# Patient Record
Sex: Female | Born: 1950
Health system: Southern US, Community
[De-identification: ages and names within clinical notes are randomized; demographics above are authoritative.]

---

## 2007-07-01 ENCOUNTER — Encounter: Admission: RE | Admit: 2007-07-01 | Discharge: 2007-07-01 | Payer: Self-pay | Admitting: Family Medicine

## 2009-10-04 ENCOUNTER — Inpatient Hospital Stay (HOSPITAL_COMMUNITY): Admission: EM | Admit: 2009-10-04 | Discharge: 2009-10-07 | Payer: Self-pay | Admitting: Emergency Medicine

## 2010-07-08 LAB — DIFFERENTIAL
Basophils Relative: 0 % (ref 0–1)
Eosinophils Absolute: 0 10*3/uL (ref 0.0–0.7)
Eosinophils Relative: 0 % (ref 0–5)
Neutrophils Relative %: 86 % — ABNORMAL HIGH (ref 43–77)

## 2010-07-08 LAB — BASIC METABOLIC PANEL
BUN: 11 mg/dL (ref 6–23)
BUN: 20 mg/dL (ref 6–23)
BUN: 8 mg/dL (ref 6–23)
CO2: 24 mEq/L (ref 19–32)
CO2: 26 mEq/L (ref 19–32)
Calcium: 8.3 mg/dL — ABNORMAL LOW (ref 8.4–10.5)
Calcium: 8.5 mg/dL (ref 8.4–10.5)
Chloride: 109 mEq/L (ref 96–112)
Chloride: 109 mEq/L (ref 96–112)
Creatinine, Ser: 0.76 mg/dL (ref 0.4–1.2)
Creatinine, Ser: 0.91 mg/dL (ref 0.4–1.2)
GFR calc Af Amer: >60 mL/min (ref >60)
GFR calc non Af Amer: >60 mL/min (ref >60)
GFR calc non Af Amer: >60 mL/min (ref >60)
Glucose, Bld: 161 mg/dL — ABNORMAL HIGH (ref 70–99)
Glucose, Bld: 195 mg/dL — ABNORMAL HIGH (ref 70–99)
Potassium: 3.1 mEq/L — ABNORMAL LOW (ref 3.5–5.1)
Potassium: 3.9 mEq/L (ref 3.5–5.1)
Sodium: 138 mEq/L (ref 135–145)
Sodium: 140 mEq/L (ref 135–145)

## 2010-07-08 LAB — COMPREHENSIVE METABOLIC PANEL
ALT: 65 U/L — ABNORMAL HIGH (ref 0–35)
AST: 77 U/L — ABNORMAL HIGH (ref 0–37)
Alkaline Phosphatase: 176 U/L — ABNORMAL HIGH (ref 39–117)
CO2: 24 mEq/L (ref 19–32)
Chloride: 101 mEq/L (ref 96–112)
GFR calc non Af Amer: 49 mL/min — ABNORMAL LOW (ref >60)
Glucose, Bld: 618 mg/dL (ref 70–99)
Potassium: 4.3 mEq/L (ref 3.5–5.1)
Sodium: 133 mEq/L — ABNORMAL LOW (ref 135–145)

## 2010-07-08 LAB — CBC
HCT: 27 % — ABNORMAL LOW (ref 36.0–46.0)
HCT: 27.1 % — ABNORMAL LOW (ref 36.0–46.0)
HCT: 28.6 % — ABNORMAL LOW (ref 36.0–46.0)
Hemoglobin: 7.7 g/dL — ABNORMAL LOW (ref 12.0–15.0)
Hemoglobin: 8.2 g/dL — ABNORMAL LOW (ref 12.0–15.0)
Hemoglobin: 8.9 g/dL — ABNORMAL LOW (ref 12.0–15.0)
MCHC: 31.4 g/dL (ref 30.0–36.0)
MCHC: 32 g/dL (ref 30.0–36.0)
MCHC: 32.2 g/dL (ref 30.0–36.0)
MCHC: 32.4 g/dL (ref 30.0–36.0)
MCHC: 32.4 g/dL (ref 30.0–36.0)
MCHC: 32.8 g/dL (ref 30.0–36.0)
MCV: 84.5 fL (ref 78.0–100.0)
MCV: 85.5 fL (ref 78.0–100.0)
Platelets: 278 10*3/uL (ref 150–400)
Platelets: 284 10*3/uL (ref 150–400)
Platelets: 296 10*3/uL (ref 150–400)
Platelets: 323 10*3/uL (ref 150–400)
Platelets: 331 10*3/uL (ref 150–400)
RBC: 2.75 MIL/uL — ABNORMAL LOW (ref 3.87–5.11)
RBC: 3.21 MIL/uL — ABNORMAL LOW (ref 3.87–5.11)
RDW: 12.7 % (ref 11.5–15.5)
RDW: 13.3 % (ref 11.5–15.5)
RDW: 13.4 % (ref 11.5–15.5)
RDW: 13.6 % (ref 11.5–15.5)
RDW: 13.7 % (ref 11.5–15.5)
RDW: 13.7 % (ref 11.5–15.5)
RDW: 13.7 % (ref 11.5–15.5)
WBC: 11.6 10*3/uL — ABNORMAL HIGH (ref 4.0–10.5)
WBC: 8.6 10*3/uL (ref 4.0–10.5)

## 2010-07-08 LAB — FERRITIN: Ferritin: 70 ng/mL (ref 10–291)

## 2010-07-08 LAB — URINALYSIS, ROUTINE W REFLEX MICROSCOPIC
Bilirubin Urine: NEGATIVE
Glucose, UA: >1000 mg/dL — AB
Nitrite: NEGATIVE
pH: 6 (ref 5.0–8.0)

## 2010-07-08 LAB — GLUCOSE, CAPILLARY
Glucose-Capillary: 129 mg/dL — ABNORMAL HIGH (ref 70–99)
Glucose-Capillary: 140 mg/dL — ABNORMAL HIGH (ref 70–99)
Glucose-Capillary: 153 mg/dL — ABNORMAL HIGH (ref 70–99)
Glucose-Capillary: 159 mg/dL — ABNORMAL HIGH (ref 70–99)
Glucose-Capillary: 163 mg/dL — ABNORMAL HIGH (ref 70–99)
Glucose-Capillary: 187 mg/dL — ABNORMAL HIGH (ref 70–99)
Glucose-Capillary: 212 mg/dL — ABNORMAL HIGH (ref 70–99)
Glucose-Capillary: 218 mg/dL — ABNORMAL HIGH (ref 70–99)
Glucose-Capillary: 220 mg/dL — ABNORMAL HIGH (ref 70–99)
Glucose-Capillary: 274 mg/dL — ABNORMAL HIGH (ref 70–99)
Glucose-Capillary: 287 mg/dL — ABNORMAL HIGH (ref 70–99)
Glucose-Capillary: 389 mg/dL — ABNORMAL HIGH (ref 70–99)
Glucose-Capillary: 548 mg/dL — ABNORMAL HIGH (ref 70–99)

## 2010-07-08 LAB — TYPE AND SCREEN

## 2010-07-08 LAB — RETICULOCYTES
RBC.: 3.52 MIL/uL — ABNORMAL LOW (ref 3.87–5.11)
Retic Count, Absolute: 73.9 10*3/uL (ref 19.0–186.0)
Retic Ct Pct: 2.1 % (ref 0.4–3.1)

## 2010-07-08 LAB — MAGNESIUM: Magnesium: 1.6 mg/dL (ref 1.5–2.5)

## 2010-07-08 LAB — HEMOGLOBIN A1C: Mean Plasma Glucose: 292 mg/dL — ABNORMAL HIGH (ref <117)

## 2010-07-08 LAB — URINE MICROSCOPIC-ADD ON

## 2010-07-08 LAB — PHOSPHORUS: Phosphorus: 3.2 mg/dL (ref 2.3–4.6)

## 2010-07-08 LAB — VITAMIN B12: Vitamin B-12: 483 pg/mL (ref 211–911)

## 2010-07-08 LAB — IRON AND TIBC
Iron: 113 ug/dL (ref 42–135)
TIBC: 278 ug/dL (ref 250–470)

## 2010-07-08 LAB — MRSA PCR SCREENING: MRSA by PCR: NEGATIVE

## 2010-07-08 LAB — ABO/RH: ABO/RH(D): B POS

## 2013-09-03 ENCOUNTER — Other Ambulatory Visit: Payer: Self-pay | Admitting: Gastroenterology

## 2013-09-03 DIAGNOSIS — R945 Abnormal results of liver function studies: Principal | ICD-10-CM

## 2013-09-03 DIAGNOSIS — R7989 Other specified abnormal findings of blood chemistry: Secondary | ICD-10-CM

## 2013-09-17 ENCOUNTER — Other Ambulatory Visit: Payer: Self-pay

## 2013-10-12 ENCOUNTER — Inpatient Hospital Stay: Admission: RE | Admit: 2013-10-12 | Payer: Self-pay | Source: Ambulatory Visit

## 2013-10-12 ENCOUNTER — Ambulatory Visit
Admission: RE | Admit: 2013-10-12 | Discharge: 2013-10-12 | Disposition: A | Discharge status: Home / Self Care | Payer: Federal, State, Local not specified - PPO | Source: Ambulatory Visit | Attending: Gastroenterology | Admitting: Gastroenterology

## 2013-10-12 DIAGNOSIS — R7989 Other specified abnormal findings of blood chemistry: Secondary | ICD-10-CM

## 2013-10-12 DIAGNOSIS — R945 Abnormal results of liver function studies: Principal | ICD-10-CM

## 2013-10-12 MED ORDER — IOHEXOL 300 MG/ML  SOLN
125.0000 mL | Freq: Once | INTRAMUSCULAR | Status: AC | PRN
  Administered 2013-10-12: 125 mL via INTRAVENOUS

## 2014-09-11 DIAGNOSIS — E161 Other hypoglycemia: Secondary | ICD-10-CM | POA: Diagnosis not present

## 2014-11-29 DIAGNOSIS — I10 Essential (primary) hypertension: Secondary | ICD-10-CM | POA: Diagnosis not present

## 2014-11-29 DIAGNOSIS — E119 Type 2 diabetes mellitus without complications: Secondary | ICD-10-CM | POA: Diagnosis not present

## 2014-11-29 DIAGNOSIS — E780 Pure hypercholesterolemia: Secondary | ICD-10-CM | POA: Diagnosis not present

## 2014-11-29 DIAGNOSIS — K219 Gastro-esophageal reflux disease without esophagitis: Secondary | ICD-10-CM | POA: Diagnosis not present

## 2014-11-29 DIAGNOSIS — Z1389 Encounter for screening for other disorder: Secondary | ICD-10-CM | POA: Diagnosis not present

## 2014-11-29 DIAGNOSIS — R609 Edema, unspecified: Secondary | ICD-10-CM | POA: Diagnosis not present

## 2014-12-28 DIAGNOSIS — G4733 Obstructive sleep apnea (adult) (pediatric): Secondary | ICD-10-CM | POA: Diagnosis not present

## 2015-01-03 DIAGNOSIS — E780 Pure hypercholesterolemia: Secondary | ICD-10-CM | POA: Diagnosis not present

## 2015-01-03 DIAGNOSIS — I10 Essential (primary) hypertension: Secondary | ICD-10-CM | POA: Diagnosis not present

## 2015-01-03 DIAGNOSIS — R609 Edema, unspecified: Secondary | ICD-10-CM | POA: Diagnosis not present

## 2015-01-03 DIAGNOSIS — E119 Type 2 diabetes mellitus without complications: Secondary | ICD-10-CM | POA: Diagnosis not present

## 2015-06-28 DIAGNOSIS — E162 Hypoglycemia, unspecified: Secondary | ICD-10-CM | POA: Diagnosis not present

## 2015-06-28 DIAGNOSIS — E161 Other hypoglycemia: Secondary | ICD-10-CM | POA: Diagnosis not present

## 2015-07-13 DIAGNOSIS — E7800 Pure hypercholesterolemia, unspecified: Secondary | ICD-10-CM | POA: Diagnosis not present

## 2015-07-13 DIAGNOSIS — Z794 Long term (current) use of insulin: Secondary | ICD-10-CM | POA: Diagnosis not present

## 2015-07-13 DIAGNOSIS — K219 Gastro-esophageal reflux disease without esophagitis: Secondary | ICD-10-CM | POA: Diagnosis not present

## 2015-07-13 DIAGNOSIS — Z0000 Encounter for general adult medical examination without abnormal findings: Secondary | ICD-10-CM | POA: Diagnosis not present

## 2015-07-13 DIAGNOSIS — Z6841 Body Mass Index (BMI) 40.0 and over, adult: Secondary | ICD-10-CM | POA: Diagnosis not present

## 2015-07-13 DIAGNOSIS — E1065 Type 1 diabetes mellitus with hyperglycemia: Secondary | ICD-10-CM | POA: Diagnosis not present

## 2015-07-13 DIAGNOSIS — R609 Edema, unspecified: Secondary | ICD-10-CM | POA: Diagnosis not present

## 2015-07-13 DIAGNOSIS — I10 Essential (primary) hypertension: Secondary | ICD-10-CM | POA: Diagnosis not present

## 2015-07-13 DIAGNOSIS — Z1239 Encounter for other screening for malignant neoplasm of breast: Secondary | ICD-10-CM | POA: Diagnosis not present

## 2015-07-21 DIAGNOSIS — Z1231 Encounter for screening mammogram for malignant neoplasm of breast: Secondary | ICD-10-CM | POA: Diagnosis not present

## 2015-07-21 DIAGNOSIS — Z131 Encounter for screening for diabetes mellitus: Secondary | ICD-10-CM | POA: Diagnosis not present

## 2015-07-21 DIAGNOSIS — Z803 Family history of malignant neoplasm of breast: Secondary | ICD-10-CM | POA: Diagnosis not present

## 2015-08-01 DIAGNOSIS — E119 Type 2 diabetes mellitus without complications: Secondary | ICD-10-CM | POA: Diagnosis not present

## 2015-08-01 DIAGNOSIS — E7800 Pure hypercholesterolemia, unspecified: Secondary | ICD-10-CM | POA: Diagnosis not present

## 2015-08-01 DIAGNOSIS — R609 Edema, unspecified: Secondary | ICD-10-CM | POA: Diagnosis not present

## 2015-08-01 DIAGNOSIS — I10 Essential (primary) hypertension: Secondary | ICD-10-CM | POA: Diagnosis not present

## 2015-09-19 DIAGNOSIS — E7800 Pure hypercholesterolemia, unspecified: Secondary | ICD-10-CM | POA: Diagnosis not present

## 2015-11-22 DIAGNOSIS — E7800 Pure hypercholesterolemia, unspecified: Secondary | ICD-10-CM | POA: Diagnosis not present

## 2015-11-22 DIAGNOSIS — E119 Type 2 diabetes mellitus without complications: Secondary | ICD-10-CM | POA: Diagnosis not present

## 2015-11-22 DIAGNOSIS — R609 Edema, unspecified: Secondary | ICD-10-CM | POA: Diagnosis not present

## 2015-11-29 DIAGNOSIS — I10 Essential (primary) hypertension: Secondary | ICD-10-CM | POA: Diagnosis not present

## 2015-11-29 DIAGNOSIS — E7800 Pure hypercholesterolemia, unspecified: Secondary | ICD-10-CM | POA: Diagnosis not present

## 2015-11-29 DIAGNOSIS — R609 Edema, unspecified: Secondary | ICD-10-CM | POA: Diagnosis not present

## 2015-11-29 DIAGNOSIS — E119 Type 2 diabetes mellitus without complications: Secondary | ICD-10-CM | POA: Diagnosis not present

## 2015-12-06 DIAGNOSIS — E119 Type 2 diabetes mellitus without complications: Secondary | ICD-10-CM | POA: Diagnosis not present

## 2016-01-01 DIAGNOSIS — G4733 Obstructive sleep apnea (adult) (pediatric): Secondary | ICD-10-CM | POA: Diagnosis not present

## 2016-01-03 DIAGNOSIS — G4733 Obstructive sleep apnea (adult) (pediatric): Secondary | ICD-10-CM | POA: Diagnosis not present

## 2016-01-15 DIAGNOSIS — E7800 Pure hypercholesterolemia, unspecified: Secondary | ICD-10-CM | POA: Diagnosis not present

## 2016-01-15 DIAGNOSIS — I10 Essential (primary) hypertension: Secondary | ICD-10-CM | POA: Diagnosis not present

## 2016-01-15 DIAGNOSIS — E1065 Type 1 diabetes mellitus with hyperglycemia: Secondary | ICD-10-CM | POA: Diagnosis not present

## 2016-01-15 DIAGNOSIS — R609 Edema, unspecified: Secondary | ICD-10-CM | POA: Diagnosis not present

## 2016-01-15 DIAGNOSIS — K219 Gastro-esophageal reflux disease without esophagitis: Secondary | ICD-10-CM | POA: Diagnosis not present

## 2016-01-15 DIAGNOSIS — S46111A Strain of muscle, fascia and tendon of long head of biceps, right arm, initial encounter: Secondary | ICD-10-CM | POA: Diagnosis not present

## 2016-01-15 DIAGNOSIS — Z1389 Encounter for screening for other disorder: Secondary | ICD-10-CM | POA: Diagnosis not present

## 2016-01-25 DIAGNOSIS — E7800 Pure hypercholesterolemia, unspecified: Secondary | ICD-10-CM | POA: Diagnosis not present

## 2016-01-25 DIAGNOSIS — I10 Essential (primary) hypertension: Secondary | ICD-10-CM | POA: Diagnosis not present

## 2016-04-02 DIAGNOSIS — I10 Essential (primary) hypertension: Secondary | ICD-10-CM | POA: Diagnosis not present

## 2016-04-02 DIAGNOSIS — E119 Type 2 diabetes mellitus without complications: Secondary | ICD-10-CM | POA: Diagnosis not present

## 2016-04-02 DIAGNOSIS — E7800 Pure hypercholesterolemia, unspecified: Secondary | ICD-10-CM | POA: Diagnosis not present

## 2016-04-02 DIAGNOSIS — R609 Edema, unspecified: Secondary | ICD-10-CM | POA: Diagnosis not present

## 2016-05-07 DIAGNOSIS — H538 Other visual disturbances: Secondary | ICD-10-CM | POA: Diagnosis not present

## 2016-05-07 DIAGNOSIS — E119 Type 2 diabetes mellitus without complications: Secondary | ICD-10-CM | POA: Diagnosis not present

## 2016-06-24 DIAGNOSIS — J45909 Unspecified asthma, uncomplicated: Secondary | ICD-10-CM | POA: Diagnosis not present

## 2016-07-17 DIAGNOSIS — K219 Gastro-esophageal reflux disease without esophagitis: Secondary | ICD-10-CM | POA: Diagnosis not present

## 2016-07-17 DIAGNOSIS — R609 Edema, unspecified: Secondary | ICD-10-CM | POA: Diagnosis not present

## 2016-07-17 DIAGNOSIS — Z0000 Encounter for general adult medical examination without abnormal findings: Secondary | ICD-10-CM | POA: Diagnosis not present

## 2016-07-17 DIAGNOSIS — E7800 Pure hypercholesterolemia, unspecified: Secondary | ICD-10-CM | POA: Diagnosis not present

## 2016-07-17 DIAGNOSIS — I10 Essential (primary) hypertension: Secondary | ICD-10-CM | POA: Diagnosis not present

## 2016-07-26 DIAGNOSIS — R609 Edema, unspecified: Secondary | ICD-10-CM | POA: Diagnosis not present

## 2016-07-26 DIAGNOSIS — E7800 Pure hypercholesterolemia, unspecified: Secondary | ICD-10-CM | POA: Diagnosis not present

## 2016-07-26 DIAGNOSIS — I10 Essential (primary) hypertension: Secondary | ICD-10-CM | POA: Diagnosis not present

## 2016-07-26 DIAGNOSIS — E119 Type 2 diabetes mellitus without complications: Secondary | ICD-10-CM | POA: Diagnosis not present

## 2016-08-02 DIAGNOSIS — I10 Essential (primary) hypertension: Secondary | ICD-10-CM | POA: Diagnosis not present

## 2016-08-02 DIAGNOSIS — R609 Edema, unspecified: Secondary | ICD-10-CM | POA: Diagnosis not present

## 2016-08-02 DIAGNOSIS — E119 Type 2 diabetes mellitus without complications: Secondary | ICD-10-CM | POA: Diagnosis not present

## 2016-08-02 DIAGNOSIS — E7800 Pure hypercholesterolemia, unspecified: Secondary | ICD-10-CM | POA: Diagnosis not present

## 2016-08-14 DIAGNOSIS — Z1231 Encounter for screening mammogram for malignant neoplasm of breast: Secondary | ICD-10-CM | POA: Diagnosis not present

## 2016-08-14 DIAGNOSIS — Z803 Family history of malignant neoplasm of breast: Secondary | ICD-10-CM | POA: Diagnosis not present

## 2016-08-23 DIAGNOSIS — R7989 Other specified abnormal findings of blood chemistry: Secondary | ICD-10-CM | POA: Diagnosis not present

## 2016-10-29 DIAGNOSIS — R748 Abnormal levels of other serum enzymes: Secondary | ICD-10-CM | POA: Diagnosis not present

## 2016-10-29 DIAGNOSIS — K7460 Unspecified cirrhosis of liver: Secondary | ICD-10-CM | POA: Diagnosis not present

## 2016-11-05 ENCOUNTER — Other Ambulatory Visit: Payer: Self-pay | Admitting: Gastroenterology

## 2016-11-05 DIAGNOSIS — K7460 Unspecified cirrhosis of liver: Secondary | ICD-10-CM

## 2016-11-05 DIAGNOSIS — R748 Abnormal levels of other serum enzymes: Secondary | ICD-10-CM

## 2016-11-08 DIAGNOSIS — R748 Abnormal levels of other serum enzymes: Secondary | ICD-10-CM | POA: Diagnosis not present

## 2016-11-21 DIAGNOSIS — R748 Abnormal levels of other serum enzymes: Secondary | ICD-10-CM | POA: Diagnosis not present

## 2016-11-22 DIAGNOSIS — R748 Abnormal levels of other serum enzymes: Secondary | ICD-10-CM | POA: Diagnosis not present

## 2016-11-22 DIAGNOSIS — K7460 Unspecified cirrhosis of liver: Secondary | ICD-10-CM | POA: Diagnosis not present

## 2016-11-23 ENCOUNTER — Ambulatory Visit
Admission: RE | Admit: 2016-11-23 | Discharge: 2016-11-23 | Disposition: A | Discharge status: Home / Self Care | Payer: Federal, State, Local not specified - PPO | Source: Ambulatory Visit | Attending: Gastroenterology | Admitting: Gastroenterology

## 2016-11-23 DIAGNOSIS — R748 Abnormal levels of other serum enzymes: Secondary | ICD-10-CM

## 2016-11-23 DIAGNOSIS — K7460 Unspecified cirrhosis of liver: Secondary | ICD-10-CM | POA: Diagnosis not present

## 2016-11-23 MED ORDER — GADOBENATE DIMEGLUMINE 529 MG/ML IV SOLN: 20.0000 mL | Freq: Once | INTRAVENOUS | Status: DC | PRN

## 2016-12-10 ENCOUNTER — Other Ambulatory Visit (HOSPITAL_COMMUNITY): Payer: Self-pay | Admitting: Gastroenterology

## 2016-12-10 DIAGNOSIS — R945 Abnormal results of liver function studies: Principal | ICD-10-CM

## 2016-12-10 DIAGNOSIS — R7989 Other specified abnormal findings of blood chemistry: Secondary | ICD-10-CM

## 2016-12-13 DIAGNOSIS — R748 Abnormal levels of other serum enzymes: Secondary | ICD-10-CM | POA: Diagnosis not present

## 2016-12-13 DIAGNOSIS — R609 Edema, unspecified: Secondary | ICD-10-CM | POA: Diagnosis not present

## 2016-12-13 DIAGNOSIS — I10 Essential (primary) hypertension: Secondary | ICD-10-CM | POA: Diagnosis not present

## 2016-12-13 DIAGNOSIS — E119 Type 2 diabetes mellitus without complications: Secondary | ICD-10-CM | POA: Diagnosis not present

## 2016-12-13 DIAGNOSIS — E7800 Pure hypercholesterolemia, unspecified: Secondary | ICD-10-CM | POA: Diagnosis not present

## 2016-12-17 ENCOUNTER — Other Ambulatory Visit: Payer: Self-pay | Admitting: Student

## 2016-12-18 ENCOUNTER — Other Ambulatory Visit: Payer: Self-pay | Admitting: Student

## 2016-12-18 ENCOUNTER — Other Ambulatory Visit: Payer: Self-pay | Admitting: Radiology

## 2016-12-19 ENCOUNTER — Ambulatory Visit (HOSPITAL_COMMUNITY)
Admission: RE | Admit: 2016-12-19 | Discharge: 2016-12-19 | Disposition: A | Discharge status: Home / Self Care | Payer: Federal, State, Local not specified - PPO | Source: Ambulatory Visit | Attending: Gastroenterology | Admitting: Gastroenterology

## 2016-12-19 DIAGNOSIS — K740 Hepatic fibrosis: Secondary | ICD-10-CM | POA: Insufficient documentation

## 2016-12-19 DIAGNOSIS — K739 Chronic hepatitis, unspecified: Secondary | ICD-10-CM | POA: Diagnosis not present

## 2016-12-19 DIAGNOSIS — R7989 Other specified abnormal findings of blood chemistry: Secondary | ICD-10-CM

## 2016-12-19 DIAGNOSIS — R945 Abnormal results of liver function studies: Secondary | ICD-10-CM

## 2016-12-19 DIAGNOSIS — K765 Hepatic veno-occlusive disease: Secondary | ICD-10-CM | POA: Diagnosis not present

## 2016-12-19 LAB — PROTIME-INR
INR: 0.86
Prothrombin Time: 11.6 seconds (ref 11.4–15.2)

## 2016-12-19 LAB — APTT: aPTT: 23 seconds — ABNORMAL LOW (ref 24–36)

## 2016-12-19 LAB — GLUCOSE, CAPILLARY: Glucose-Capillary: 114 mg/dL — ABNORMAL HIGH (ref 65–99)

## 2016-12-19 LAB — CBC
HCT: 40.3 % (ref 36.0–46.0)
HEMOGLOBIN: 13.1 g/dL (ref 12.0–15.0)
MCH: 28.2 pg (ref 26.0–34.0)
MCHC: 32.5 g/dL (ref 30.0–36.0)
MCV: 86.9 fL (ref 78.0–100.0)
Platelets: 335 10*3/uL (ref 150–400)
RBC: 4.64 MIL/uL (ref 3.87–5.11)
RDW: 15.2 % (ref 11.5–15.5)
WBC: 12 10*3/uL — ABNORMAL HIGH (ref 4.0–10.5)

## 2016-12-19 MED ORDER — MIDAZOLAM HCL 2 MG/2ML IJ SOLN
INTRAMUSCULAR | Status: AC | PRN
  Administered 2016-12-19 (×2): 1 mg via INTRAVENOUS

## 2016-12-19 MED ORDER — AMLODIPINE BESYLATE 10 MG PO TABS
10.0000 mg | ORAL_TABLET | ORAL | Status: AC
  Administered 2016-12-19: 10 mg via ORAL
  Filled 2016-12-19: qty 1

## 2016-12-19 MED ORDER — HYDROCODONE-ACETAMINOPHEN 5-325 MG PO TABS
1.0000 | ORAL_TABLET | ORAL | Status: DC | PRN
Start: 2016-12-19 — End: 2016-12-20

## 2016-12-19 MED ORDER — SODIUM CHLORIDE 0.9 % IV SOLN
INTRAVENOUS | Status: DC
  Administered 2016-12-19: 13:00:00 via INTRAVENOUS

## 2016-12-19 MED ORDER — FENTANYL CITRATE (PF) 100 MCG/2ML IJ SOLN
INTRAMUSCULAR | Status: AC | PRN
  Administered 2016-12-19: 50 ug via INTRAVENOUS

## 2016-12-19 MED ORDER — MIDAZOLAM HCL 2 MG/2ML IJ SOLN
INTRAMUSCULAR | Status: AC
  Filled 2016-12-19: qty 4

## 2016-12-19 MED ORDER — FENTANYL CITRATE (PF) 100 MCG/2ML IJ SOLN
INTRAMUSCULAR | Status: AC
  Filled 2016-12-19: qty 2

## 2016-12-19 MED ORDER — LIDOCAINE HCL (PF) 1 % IJ SOLN
INTRAMUSCULAR | Status: AC
  Filled 2016-12-19: qty 30

## 2016-12-19 MED ORDER — AMLODIPINE BESYLATE 5 MG PO TABS
ORAL_TABLET | ORAL | Status: AC
  Filled 2016-12-19: qty 2

## 2016-12-19 NOTE — H&P (Signed)
Chief Complaint: Patient was seen in consultation today for elevated LFTs  Referring Physician(s): Schooler,Vincent  Supervising Physician: Oley Balm  Patient Status: [facility] - Out-pt  History of Present Illness: Brittany Hayes is a 66 y.o. female with past medical history of cirrhosis, suspected autoimmune hepatitis presents with persistently elevated LFTs and morphologic changes of cirrhosis on MRI.  IR consulted for random liver biopsy at the request of Dr. Oley Balm.   Patient presents today in her usual state of health.  Patient is NPO.  She does not take blood thinners.   No past medical history on file.  No past surgical history on file.  Allergies: Patient has no allergy information on record.  Medications: Prior to Admission medications   Not on File     No family history on file.  Social History   Social History  . Marital status: Single    Spouse name: N/A  . Number of children: N/A  . Years of education: N/A   Social History Main Topics  . Smoking status: Not on file  . Smokeless tobacco: Not on file  . Alcohol use Not on file  . Drug use: Unknown  . Sexual activity: Not on file   Other Topics Concern  . Not on file   Social History Narrative  . No narrative on file   Review of Systems  Constitutional: Negative for fatigue and fever.  Respiratory: Negative for cough and shortness of breath.   Cardiovascular: Negative for chest pain.  Psychiatric/Behavioral: Negative for behavioral problems and confusion.    Vital Signs: BP (!) 188/86   Pulse 92   Temp 98.8 F (37.1 C) (Oral)   Ht 5\' 3"  (1.6 m)   Wt 290 lb (131.5 kg)   SpO2 99%   BMI 51.37 kg/m   Physical Exam  Constitutional: She is oriented to person, place, and time. She appears well-developed.  Cardiovascular: Normal rate, regular rhythm and normal heart sounds.   Pulmonary/Chest: Effort normal and breath sounds normal. No respiratory distress.  Neurological: She is  alert and oriented to person, place, and time.  Skin: Skin is warm and dry.  Psychiatric: She has a normal mood and affect. Her behavior is normal. Judgment and thought content normal.  Nursing note and vitals reviewed.   Mallampati Score:  MD Evaluation Airway: WNL Heart: WNL Abdomen: WNL Chest/ Lungs: WNL ASA  Classification: 3 Mallampati/Airway Score: Two  Imaging: Mr Abdomen Mrcp Vivien Rossetti Contast  Result Date: 11/23/2016 CLINICAL DATA:  Cirrhosis. EXAM: MRI ABDOMEN WITHOUT AND WITH CONTRAST (INCLUDING MRCP) TECHNIQUE: Multiplanar multisequence MR imaging of the abdomen was performed both before and after the administration of intravenous contrast. Heavily T2-weighted images of the biliary and pancreatic ducts were obtained, and three-dimensional MRCP images were rendered by post processing. CONTRAST:  20 mL MultiHance COMPARISON:  10/12/2013 FINDINGS: Lower chest:  Lung bases are clear. Hepatobiliary: No hepatic steatosis. No intrahepatic duct dilatation. Gallbladder normal. Common bile duct normal. No focal lesion on the noncontrast pulse sequences. On the post-contrast imaging, no enhancing hepatic parenchymal lesion. Caudate lobe is mildly prominent. Minimal if any parenchymal nodularity. Pancreas: Normal pancreatic parenchymal intensity. No ductal dilatation or inflammation. Spleen: Normal spleen. Adrenals/urinary tract: Adrenal glands are normal. Nonenhancing cysts of the LEFT kidney Stomach/Bowel: Stomach and limited of the small bowel is unremarkable Vascular/Lymphatic: Abdominal aortic normal caliber. No retroperitoneal periportal lymphadenopathy. Musculoskeletal: No aggressive osseous lesion IMPRESSION: 1. No enhancing lesion liver to suggests hepatoma. 2. Minimal morphologic changes  of cirrhosis with only mild enlarged caudate lobe. No fibrosis evident. Consider ultrasound elastography. 3. Normal biliary tree. 4. No hepatic steatosis. Electronically Signed   By: Genevive BiStewart  Edmunds M.D.    On: 11/23/2016 12:32    Labs:  CBC:  Recent Labs  12/19/16 1208  WBC 12.0*  HGB 13.1  HCT 40.3  PLT 335    COAGS:  Recent Labs  12/19/16 1208  INR 0.86  APTT 23*    BMP: No results for input(s): NA, K, CL, CO2, GLUCOSE, BUN, CALCIUM, CREATININE, GFRNONAA, GFRAA in the last 8760 hours.  Invalid input(s): CMP  LIVER FUNCTION TESTS: No results for input(s): BILITOT, AST, ALT, ALKPHOS, PROT, ALBUMIN in the last 8760 hours.  TUMOR MARKERS: No results for input(s): AFPTM, CEA, CA199, CHROMGRNA in the last 8760 hours.  Assessment and Plan: Patient with past medical history of cirrhosis and suspected autoimmune hepatitis presents with morphological changes of cirrhosis on recent MRI.  IR consulted for random liver biopsy at the request of Dr. Bosie ClosSchooler. Case reviewed by Dr. Deanne CofferHassell who approves patient for procedure.  Patient presents today in their usual state of health.  She has been NPO and is not currently on blood thinners.  Risks and benefits discussed with the patient including, but not limited to bleeding, infection, damage to adjacent structures or low yield requiring additional tests. All of the patient's questions were answered, patient is agreeable to proceed. Consent signed and in chart.  Thank you for this interesting consult.  I greatly enjoyed meeting Brittany Hayes and look forward to participating in their care.  A copy of this report was sent to the requesting provider on this date.  Electronically Signed: Hoyt KochKacie Sue-Ellen Jermone Geister, PA 12/19/2016, 12:51 PM   I spent a total of  30 Minutes   in face to face in clinical consultation, greater than 50% of which was counseling/coordinating care for elevated LFTs.

## 2016-12-19 NOTE — Discharge Instructions (Signed)
Liver Biopsy, Care After °These instructions give you information on caring for yourself after your procedure. Your doctor may also give you more specific instructions. Call your doctor if you have any problems or questions after your procedure. °Follow these instructions at home: °· Rest at home for 1-2 days or as told by your doctor. °· Have someone stay with you for at least 24 hours. °· Do not do these things in the first 24 hours: °? Drive. °? Use machinery. °? Take care of other people. °? Sign legal documents. °? Take a bath or shower. °· There are many different ways to close and cover a cut (incision). For example, a cut can be closed with stitches, skin glue, or adhesive strips. Follow your doctor's instructions on: °? Taking care of your cut. °? Changing and removing your bandage (dressing). °? Removing whatever was used to close your cut. °· Do not drink alcohol in the first week. °· Do not lift more than 5 pounds or play contact sports for the first 2 weeks. °· Take medicines only as told by your doctor. For 1 week, do not take medicine that has aspirin in it or medicines like ibuprofen. °· Get your test results. °Contact a doctor if: °· A cut bleeds and leaves more than just a small spot of blood. °· A cut is red, puffs up (swells), or hurts more than before. °· Fluid or something else comes from a cut. °· A cut smells bad. °· You have a fever or chills. °Get help right away if: °· You have swelling, bloating, or pain in your belly (abdomen). °· You get dizzy or faint. °· You have a rash. °· You feel sick to your stomach (nauseous) or throw up (vomit). °· You have trouble breathing, feel short of breath, or feel faint. °· Your chest hurts. °· You have problems talking or seeing. °· You have trouble balancing or moving your arms or legs. °This information is not intended to replace advice given to you by your health care provider. Make sure you discuss any questions you have with your health care  provider. °Document Released: 01/16/2008 Document Revised: 09/14/2015 Document Reviewed: 06/04/2013 °Elsevier Interactive Patient Education © 2018 [employer] ° °

## 2016-12-19 NOTE — Procedures (Signed)
  Procedure:   US liver core biopsy 18g x3 to surg path Preprocedure diagnosis:  abnl LFT Postprocedure diagnosis:  same EBL:     minimal Complications:   none immediate  See full dictation in [facility] P[facility].  Thora Lance. Letta Cargile MD Main # [phone] Pager  [phone]

## 2016-12-19 NOTE — Progress Notes (Addendum)
Spoke with Dondra PraderDominique daughter @ [phone] notified pt D/C time 1630 and she will come in for instructions.

## 2016-12-24 DIAGNOSIS — R748 Abnormal levels of other serum enzymes: Secondary | ICD-10-CM | POA: Diagnosis not present

## 2017-01-01 DIAGNOSIS — G4733 Obstructive sleep apnea (adult) (pediatric): Secondary | ICD-10-CM | POA: Diagnosis not present

## 2017-01-03 DIAGNOSIS — G4733 Obstructive sleep apnea (adult) (pediatric): Secondary | ICD-10-CM | POA: Diagnosis not present

## 2017-01-06 DIAGNOSIS — R748 Abnormal levels of other serum enzymes: Secondary | ICD-10-CM | POA: Diagnosis not present

## 2017-01-06 DIAGNOSIS — K7460 Unspecified cirrhosis of liver: Secondary | ICD-10-CM | POA: Diagnosis not present

## 2017-01-06 DIAGNOSIS — K716 Toxic liver disease with hepatitis, not elsewhere classified: Secondary | ICD-10-CM | POA: Diagnosis not present

## 2017-01-06 DIAGNOSIS — T50905A Adverse effect of unspecified drugs, medicaments and biological substances, initial encounter: Secondary | ICD-10-CM | POA: Diagnosis not present

## 2017-01-17 DIAGNOSIS — K219 Gastro-esophageal reflux disease without esophagitis: Secondary | ICD-10-CM | POA: Diagnosis not present

## 2017-01-17 DIAGNOSIS — E7800 Pure hypercholesterolemia, unspecified: Secondary | ICD-10-CM | POA: Diagnosis not present

## 2017-01-17 DIAGNOSIS — R609 Edema, unspecified: Secondary | ICD-10-CM | POA: Diagnosis not present

## 2017-01-17 DIAGNOSIS — I10 Essential (primary) hypertension: Secondary | ICD-10-CM | POA: Diagnosis not present

## 2017-01-17 DIAGNOSIS — E1065 Type 1 diabetes mellitus with hyperglycemia: Secondary | ICD-10-CM | POA: Diagnosis not present

## 2017-03-10 DIAGNOSIS — Z1159 Encounter for screening for other viral diseases: Secondary | ICD-10-CM | POA: Diagnosis not present

## 2017-03-10 DIAGNOSIS — R945 Abnormal results of liver function studies: Secondary | ICD-10-CM | POA: Diagnosis not present

## 2017-03-10 DIAGNOSIS — Z7289 Other problems related to lifestyle: Secondary | ICD-10-CM | POA: Diagnosis not present

## 2017-03-10 DIAGNOSIS — R748 Abnormal levels of other serum enzymes: Secondary | ICD-10-CM | POA: Diagnosis not present

## 2017-03-10 DIAGNOSIS — Z79899 Other long term (current) drug therapy: Secondary | ICD-10-CM | POA: Diagnosis not present

## 2017-03-10 DIAGNOSIS — E119 Type 2 diabetes mellitus without complications: Secondary | ICD-10-CM | POA: Diagnosis not present

## 2017-03-10 DIAGNOSIS — Z794 Long term (current) use of insulin: Secondary | ICD-10-CM | POA: Diagnosis not present

## 2017-03-10 DIAGNOSIS — R799 Abnormal finding of blood chemistry, unspecified: Secondary | ICD-10-CM | POA: Diagnosis not present

## 2017-04-23 DIAGNOSIS — E119 Type 2 diabetes mellitus without complications: Secondary | ICD-10-CM | POA: Diagnosis not present

## 2017-04-23 DIAGNOSIS — E7800 Pure hypercholesterolemia, unspecified: Secondary | ICD-10-CM | POA: Diagnosis not present

## 2017-04-23 DIAGNOSIS — I10 Essential (primary) hypertension: Secondary | ICD-10-CM | POA: Diagnosis not present

## 2017-04-23 DIAGNOSIS — R609 Edema, unspecified: Secondary | ICD-10-CM | POA: Diagnosis not present

## 2017-07-28 DIAGNOSIS — Z1211 Encounter for screening for malignant neoplasm of colon: Secondary | ICD-10-CM | POA: Diagnosis not present

## 2017-07-28 DIAGNOSIS — K219 Gastro-esophageal reflux disease without esophagitis: Secondary | ICD-10-CM | POA: Diagnosis not present

## 2017-07-28 DIAGNOSIS — Z0001 Encounter for general adult medical examination with abnormal findings: Secondary | ICD-10-CM | POA: Diagnosis not present

## 2017-07-28 DIAGNOSIS — K7460 Unspecified cirrhosis of liver: Secondary | ICD-10-CM | POA: Diagnosis not present

## 2017-07-28 DIAGNOSIS — R748 Abnormal levels of other serum enzymes: Secondary | ICD-10-CM | POA: Diagnosis not present

## 2017-07-28 DIAGNOSIS — I10 Essential (primary) hypertension: Secondary | ICD-10-CM | POA: Diagnosis not present

## 2017-07-28 DIAGNOSIS — E1065 Type 1 diabetes mellitus with hyperglycemia: Secondary | ICD-10-CM | POA: Diagnosis not present

## 2017-07-28 DIAGNOSIS — Z1389 Encounter for screening for other disorder: Secondary | ICD-10-CM | POA: Diagnosis not present

## 2017-07-28 DIAGNOSIS — E7800 Pure hypercholesterolemia, unspecified: Secondary | ICD-10-CM | POA: Diagnosis not present

## 2017-08-18 DIAGNOSIS — Z1231 Encounter for screening mammogram for malignant neoplasm of breast: Secondary | ICD-10-CM | POA: Diagnosis not present

## 2017-08-18 DIAGNOSIS — Z803 Family history of malignant neoplasm of breast: Secondary | ICD-10-CM | POA: Diagnosis not present

## 2017-09-04 DIAGNOSIS — I10 Essential (primary) hypertension: Secondary | ICD-10-CM | POA: Diagnosis not present

## 2017-09-04 DIAGNOSIS — E119 Type 2 diabetes mellitus without complications: Secondary | ICD-10-CM | POA: Diagnosis not present

## 2017-09-04 DIAGNOSIS — E7800 Pure hypercholesterolemia, unspecified: Secondary | ICD-10-CM | POA: Diagnosis not present

## 2017-09-04 DIAGNOSIS — R609 Edema, unspecified: Secondary | ICD-10-CM | POA: Diagnosis not present

## 2017-09-11 DIAGNOSIS — E119 Type 2 diabetes mellitus without complications: Secondary | ICD-10-CM | POA: Diagnosis not present

## 2017-09-11 DIAGNOSIS — R609 Edema, unspecified: Secondary | ICD-10-CM | POA: Diagnosis not present

## 2017-09-11 DIAGNOSIS — I10 Essential (primary) hypertension: Secondary | ICD-10-CM | POA: Diagnosis not present

## 2017-09-11 DIAGNOSIS — E7800 Pure hypercholesterolemia, unspecified: Secondary | ICD-10-CM | POA: Diagnosis not present

## 2017-09-23 DIAGNOSIS — E119 Type 2 diabetes mellitus without complications: Secondary | ICD-10-CM | POA: Diagnosis not present

## 2017-09-26 DIAGNOSIS — K7689 Other specified diseases of liver: Secondary | ICD-10-CM | POA: Diagnosis not present

## 2017-09-26 DIAGNOSIS — R748 Abnormal levels of other serum enzymes: Secondary | ICD-10-CM | POA: Diagnosis not present

## 2017-10-17 DIAGNOSIS — D126 Benign neoplasm of colon, unspecified: Secondary | ICD-10-CM | POA: Diagnosis not present

## 2017-10-17 DIAGNOSIS — Z1211 Encounter for screening for malignant neoplasm of colon: Secondary | ICD-10-CM | POA: Diagnosis not present

## 2017-10-17 DIAGNOSIS — K640 First degree hemorrhoids: Secondary | ICD-10-CM | POA: Diagnosis not present

## 2017-10-21 DIAGNOSIS — D126 Benign neoplasm of colon, unspecified: Secondary | ICD-10-CM | POA: Diagnosis not present

## 2017-10-21 DIAGNOSIS — Z1211 Encounter for screening for malignant neoplasm of colon: Secondary | ICD-10-CM | POA: Diagnosis not present

## 2018-01-14 DIAGNOSIS — G4733 Obstructive sleep apnea (adult) (pediatric): Secondary | ICD-10-CM | POA: Diagnosis not present

## 2018-01-23 DIAGNOSIS — E119 Type 2 diabetes mellitus without complications: Secondary | ICD-10-CM | POA: Diagnosis not present

## 2018-01-23 DIAGNOSIS — R609 Edema, unspecified: Secondary | ICD-10-CM | POA: Diagnosis not present

## 2018-01-23 DIAGNOSIS — Z23 Encounter for immunization: Secondary | ICD-10-CM | POA: Diagnosis not present

## 2018-01-23 DIAGNOSIS — E7800 Pure hypercholesterolemia, unspecified: Secondary | ICD-10-CM | POA: Diagnosis not present

## 2018-01-23 DIAGNOSIS — I10 Essential (primary) hypertension: Secondary | ICD-10-CM | POA: Diagnosis not present

## 2018-02-06 DIAGNOSIS — Z13820 Encounter for screening for osteoporosis: Secondary | ICD-10-CM | POA: Diagnosis not present

## 2018-02-21 IMAGING — MR MR ABDOMEN WO/W CM MRCP
11 of 17 series · 31 of 48 positions shown · IV contrast (20 Multihance)
Comparison: 10/12/2013

CLINICAL DATA: Cirrhosis.

EXAM:
MRI ABDOMEN WITHOUT AND WITH CONTRAST (INCLUDING MRCP)
TECHNIQUE: Multiplanar multisequence MR imaging of the abdomen was performed
both before and after the administration of intravenous contrast.
Heavily T2-weighted images of the biliary and pancreatic ducts were
obtained, and three-dimensional MRCP images were rendered by post
processing.
CONTRAST:  20 mL MultiHance

[Series 3: bSSFP · coronal · 7.0mm · 0.70mm/px · 1 of 34 slices shown]
[im 1/34]
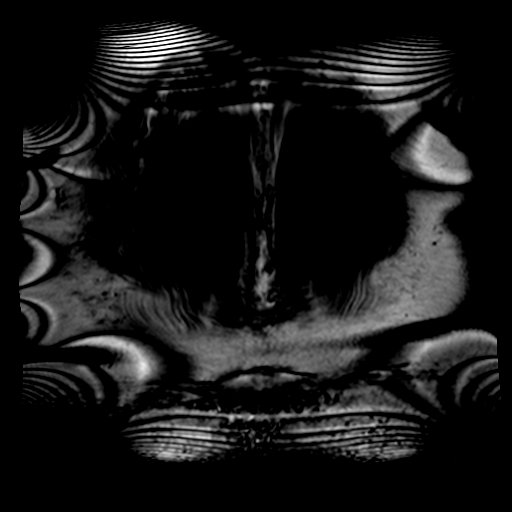

[Series 4: axial in out · axial · 6.0mm · 0.70mm/px · z∈[-152,+128]mm · 3 of 80 slices shown]
[im 1/80]
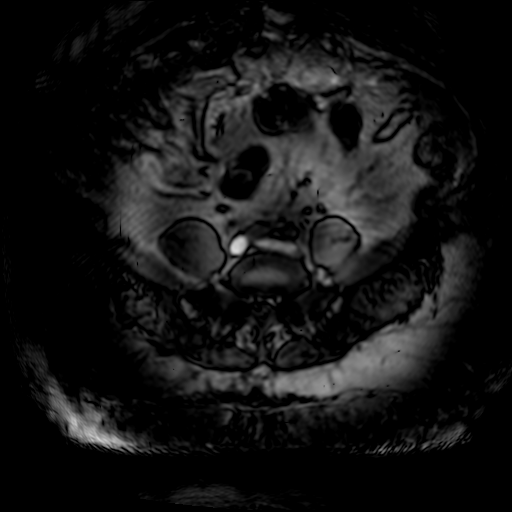
[im 40/80]
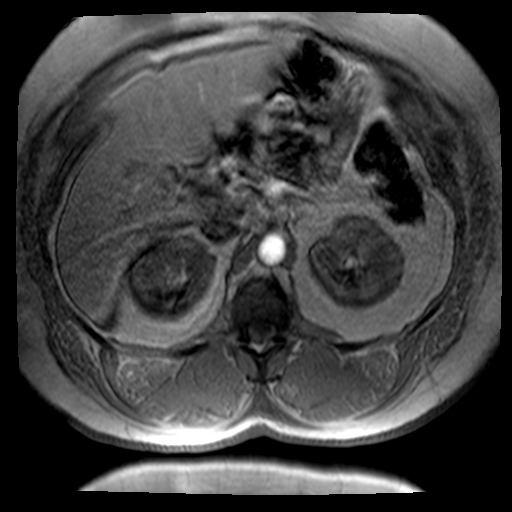
[im 80/80]
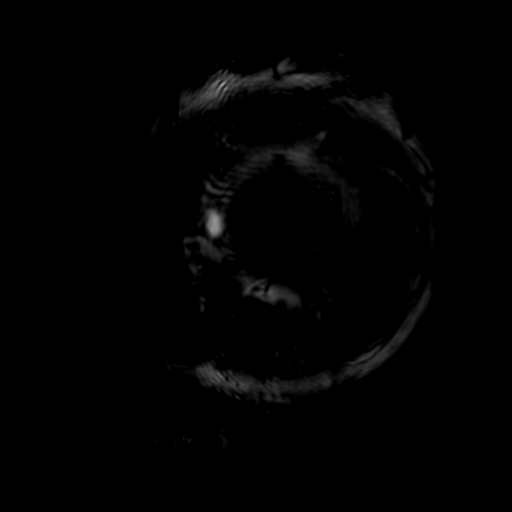

[Series 15: T2 · axial · 6.0mm · 0.74mm/px · 1 of 33 slices shown (1 of 2)]
[im 1/33]
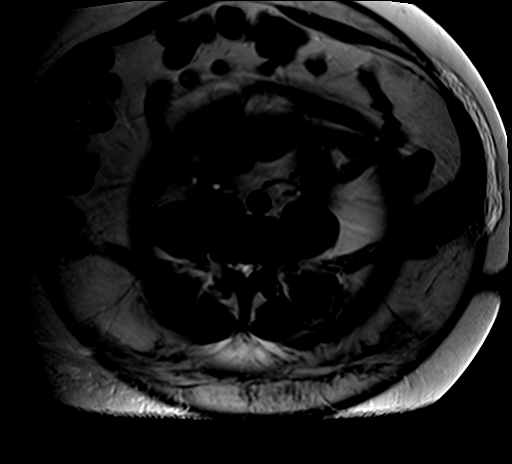

[Series 16: [id number]_p2 · axial · 6.0mm · 1.98mm/px · z∈[-132,+110]mm · 4 of 96 slices shown]
[im 1/96]
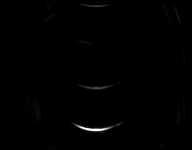
[im 32/96]
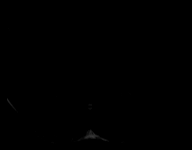
[im 64/96]
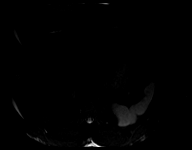
[im 96/96]
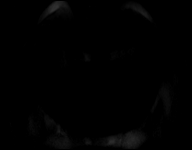

[Series 17: [id number] · axial · 6.0mm · 1.98mm/px · z∈[-132,+110]mm · 2 of 32 slices shown]
[im 1/32]
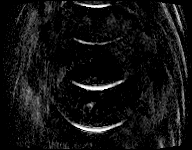
[im 32/32]
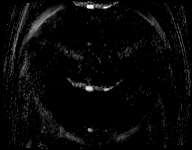

[Series 19: T2 · axial · 6.0mm · 1.41mm/px · z∈[-139,+106]mm · 2 of 35 slices shown (2 of 2)]
[im 1/35]
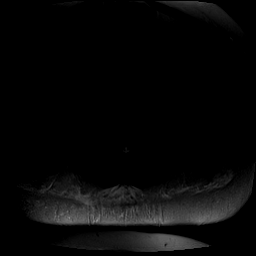
[im 35/35]
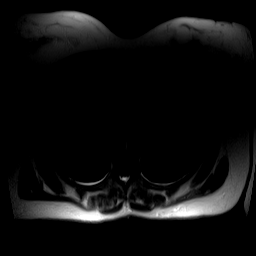

[Series 20: T2 fat-sat · coronal · 5.0mm · 1.41mm/px · 2 of 43 slices shown]
[im 1/43]
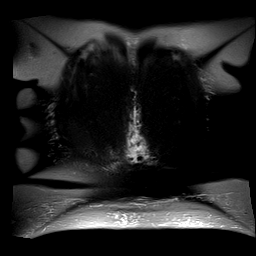
[im 43/43]
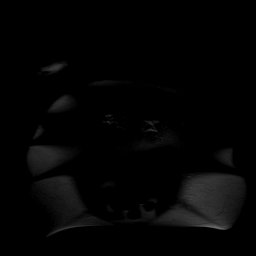

[Series 23: T1 dynamic fat-sat · axial · non-contrast · 3.0mm · 0.88mm/px · z∈[-141,+96]mm · 4 of 80 slices shown]
[im 1/80]
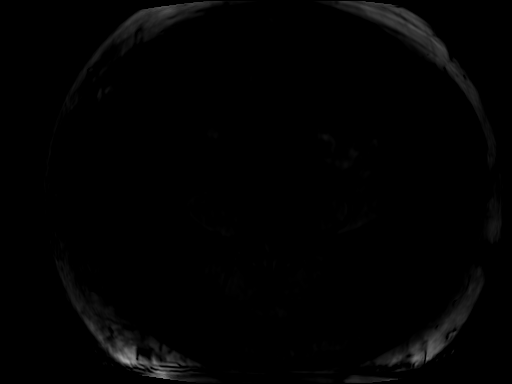
[im 27/80]
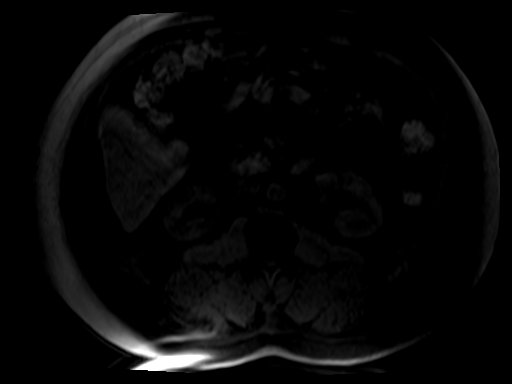
[im 53/80]
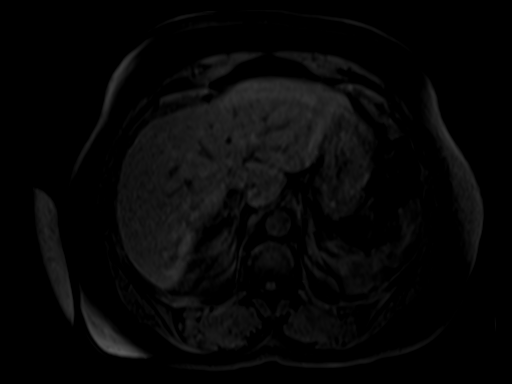
[im 80/80]
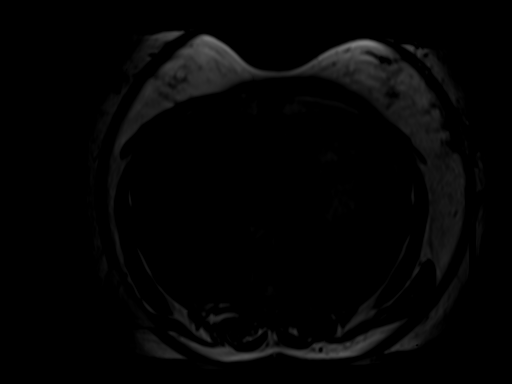

[Series 24: axial post (id) · axial · 3.0mm · 0.88mm/px · z∈[-141,+96]mm · 4 of 80 slices shown]
[im 1/80]
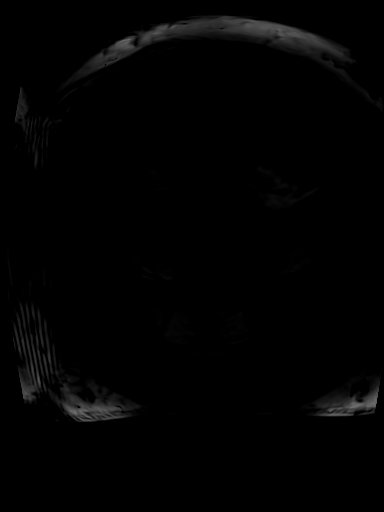
[im 27/80]
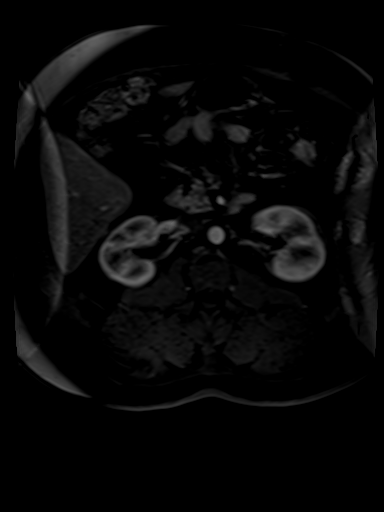
[im 53/80]
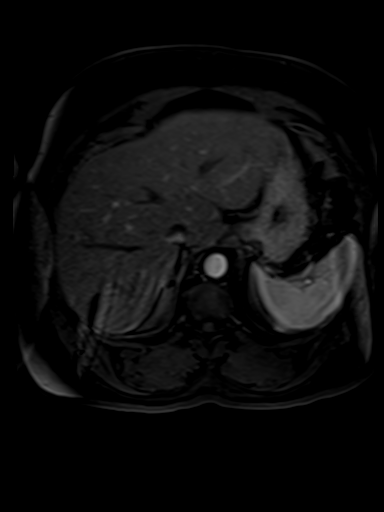
[im 80/80]
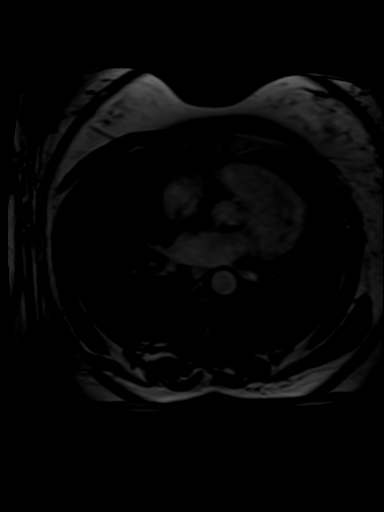

[Series 25: axial post (id)_sub · axial · 3.0mm · 0.88mm/px · z∈[-141,+96]mm · 4 of 80 slices shown]
[im 1/80]
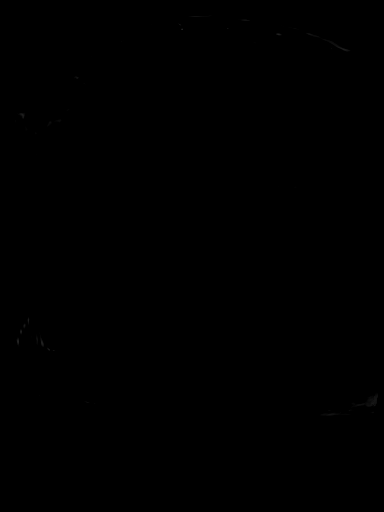
[im 27/80]
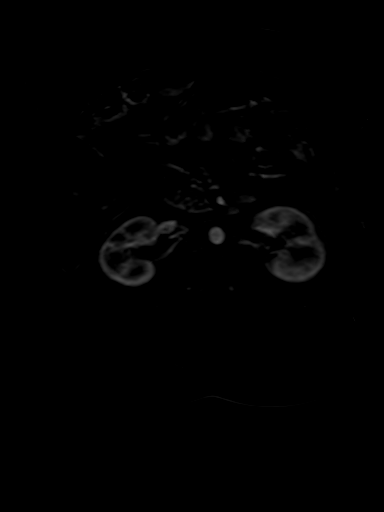
[im 53/80]
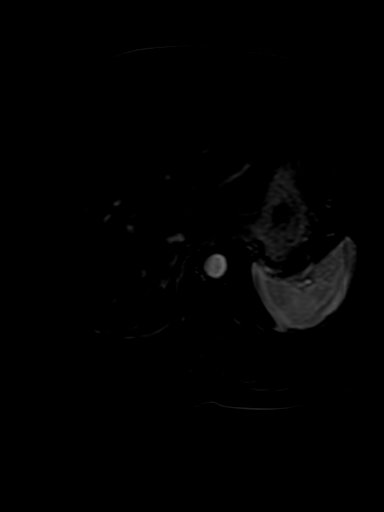
[im 80/80]
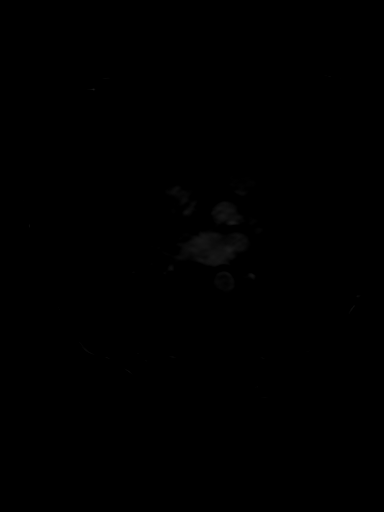

[Series 27: axial post 45 · axial · 3.0mm · 0.88mm/px · z∈[-141,+96]mm · 4 of 80 slices shown]
[im 1/80]
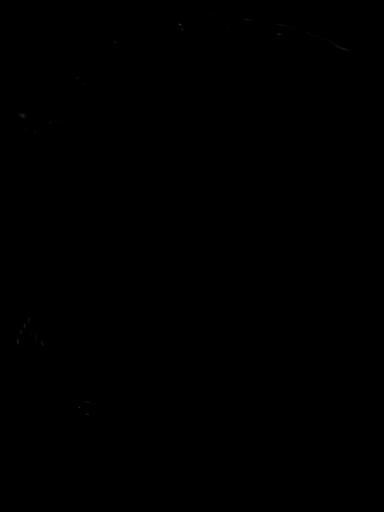
[im 27/80]
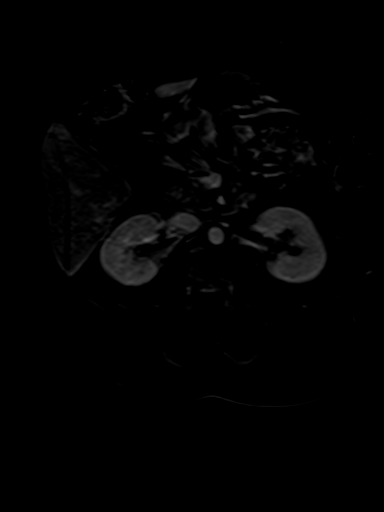
[im 53/80]
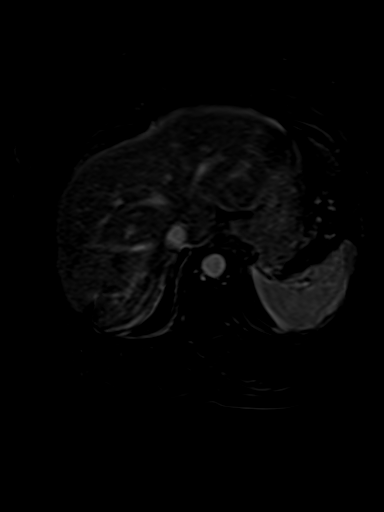
[im 80/80]
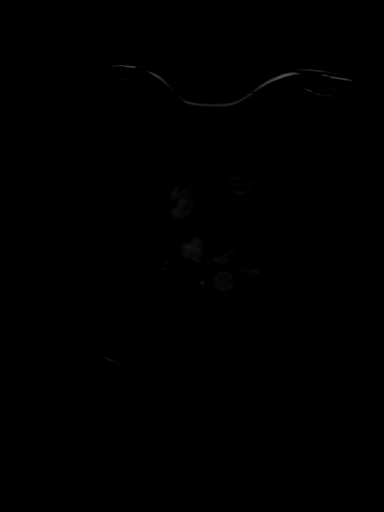

[31 of 48 positions shown; findings below may reference images not displayed]

FINDINGS: Lower chest:  Lung bases are clear.

Hepatobiliary: No hepatic steatosis. No intrahepatic duct
dilatation. Gallbladder normal. Common bile duct normal.

No focal lesion on the noncontrast pulse sequences. On the
post-contrast imaging, no enhancing hepatic parenchymal lesion.
Caudate lobe is mildly prominent. Minimal if any parenchymal
nodularity.

Pancreas: Normal pancreatic parenchymal intensity. No ductal
dilatation or inflammation.

Spleen: Normal spleen.

Adrenals/urinary tract: Adrenal glands are normal. Nonenhancing
cysts of the LEFT kidney

Stomach/Bowel: Stomach and limited of the small bowel is
unremarkable

Vascular/Lymphatic: Abdominal aortic normal caliber. No
retroperitoneal periportal lymphadenopathy.

Musculoskeletal: No aggressive osseous lesion
IMPRESSION: 1. No enhancing lesion liver to suggests hepatoma.
2. Minimal morphologic changes of cirrhosis with only mild enlarged
caudate lobe. No fibrosis evident. Consider ultrasound elastography.
3. Normal biliary tree.
4. No hepatic steatosis.

## 2018-02-24 DIAGNOSIS — R748 Abnormal levels of other serum enzymes: Secondary | ICD-10-CM | POA: Diagnosis not present

## 2018-02-24 DIAGNOSIS — R609 Edema, unspecified: Secondary | ICD-10-CM | POA: Diagnosis not present

## 2018-02-24 DIAGNOSIS — Z6841 Body Mass Index (BMI) 40.0 and over, adult: Secondary | ICD-10-CM | POA: Diagnosis not present

## 2018-02-24 DIAGNOSIS — E7800 Pure hypercholesterolemia, unspecified: Secondary | ICD-10-CM | POA: Diagnosis not present

## 2018-02-24 DIAGNOSIS — M791 Myalgia, unspecified site: Secondary | ICD-10-CM | POA: Diagnosis not present

## 2018-02-24 DIAGNOSIS — I10 Essential (primary) hypertension: Secondary | ICD-10-CM | POA: Diagnosis not present

## 2018-02-24 DIAGNOSIS — K219 Gastro-esophageal reflux disease without esophagitis: Secondary | ICD-10-CM | POA: Diagnosis not present

## 2018-02-24 DIAGNOSIS — R740 Nonspecific elevation of levels of transaminase and lactic acid dehydrogenase [LDH]: Secondary | ICD-10-CM | POA: Diagnosis not present

## 2018-02-24 DIAGNOSIS — E1065 Type 1 diabetes mellitus with hyperglycemia: Secondary | ICD-10-CM | POA: Diagnosis not present

## 2018-04-03 DIAGNOSIS — R945 Abnormal results of liver function studies: Secondary | ICD-10-CM | POA: Diagnosis not present

## 2018-05-14 DIAGNOSIS — H43393 Other vitreous opacities, bilateral: Secondary | ICD-10-CM | POA: Diagnosis not present

## 2018-05-14 DIAGNOSIS — H527 Unspecified disorder of refraction: Secondary | ICD-10-CM | POA: Diagnosis not present

## 2018-05-14 DIAGNOSIS — E119 Type 2 diabetes mellitus without complications: Secondary | ICD-10-CM | POA: Diagnosis not present

## 2018-05-14 DIAGNOSIS — H25813 Combined forms of age-related cataract, bilateral: Secondary | ICD-10-CM | POA: Diagnosis not present

## 2018-05-14 DIAGNOSIS — Z794 Long term (current) use of insulin: Secondary | ICD-10-CM | POA: Diagnosis not present

## 2018-05-27 DIAGNOSIS — K7689 Other specified diseases of liver: Secondary | ICD-10-CM | POA: Diagnosis not present

## 2018-05-27 DIAGNOSIS — R748 Abnormal levels of other serum enzymes: Secondary | ICD-10-CM | POA: Diagnosis not present

## 2018-05-27 DIAGNOSIS — I10 Essential (primary) hypertension: Secondary | ICD-10-CM | POA: Diagnosis not present

## 2018-05-27 DIAGNOSIS — E119 Type 2 diabetes mellitus without complications: Secondary | ICD-10-CM | POA: Diagnosis not present

## 2018-05-27 DIAGNOSIS — R609 Edema, unspecified: Secondary | ICD-10-CM | POA: Diagnosis not present

## 2018-05-27 DIAGNOSIS — E7800 Pure hypercholesterolemia, unspecified: Secondary | ICD-10-CM | POA: Diagnosis not present

## 2018-08-03 DIAGNOSIS — Z0000 Encounter for general adult medical examination without abnormal findings: Secondary | ICD-10-CM | POA: Diagnosis not present

## 2018-08-03 DIAGNOSIS — E7800 Pure hypercholesterolemia, unspecified: Secondary | ICD-10-CM | POA: Diagnosis not present

## 2018-08-03 DIAGNOSIS — Z1389 Encounter for screening for other disorder: Secondary | ICD-10-CM | POA: Diagnosis not present

## 2018-08-03 DIAGNOSIS — R748 Abnormal levels of other serum enzymes: Secondary | ICD-10-CM | POA: Diagnosis not present

## 2018-08-03 DIAGNOSIS — Z6841 Body Mass Index (BMI) 40.0 and over, adult: Secondary | ICD-10-CM | POA: Diagnosis not present

## 2018-08-03 DIAGNOSIS — M791 Myalgia, unspecified site: Secondary | ICD-10-CM | POA: Diagnosis not present

## 2018-08-03 DIAGNOSIS — I10 Essential (primary) hypertension: Secondary | ICD-10-CM | POA: Diagnosis not present

## 2018-08-03 DIAGNOSIS — K219 Gastro-esophageal reflux disease without esophagitis: Secondary | ICD-10-CM | POA: Diagnosis not present

## 2018-08-03 DIAGNOSIS — E1065 Type 1 diabetes mellitus with hyperglycemia: Secondary | ICD-10-CM | POA: Diagnosis not present

## 2018-08-03 DIAGNOSIS — F0390 Unspecified dementia without behavioral disturbance: Secondary | ICD-10-CM | POA: Diagnosis not present

## 2018-08-03 DIAGNOSIS — R609 Edema, unspecified: Secondary | ICD-10-CM | POA: Diagnosis not present

## 2018-08-07 DIAGNOSIS — G4733 Obstructive sleep apnea (adult) (pediatric): Secondary | ICD-10-CM | POA: Diagnosis not present

## 2018-08-10 DIAGNOSIS — K7689 Other specified diseases of liver: Secondary | ICD-10-CM | POA: Diagnosis not present

## 2018-08-10 DIAGNOSIS — E119 Type 2 diabetes mellitus without complications: Secondary | ICD-10-CM | POA: Diagnosis not present

## 2018-08-10 DIAGNOSIS — E7800 Pure hypercholesterolemia, unspecified: Secondary | ICD-10-CM | POA: Diagnosis not present

## 2018-08-10 DIAGNOSIS — I10 Essential (primary) hypertension: Secondary | ICD-10-CM | POA: Diagnosis not present

## 2018-08-21 DIAGNOSIS — E119 Type 2 diabetes mellitus without complications: Secondary | ICD-10-CM | POA: Diagnosis not present

## 2018-08-21 DIAGNOSIS — E7800 Pure hypercholesterolemia, unspecified: Secondary | ICD-10-CM | POA: Diagnosis not present

## 2018-08-21 DIAGNOSIS — R609 Edema, unspecified: Secondary | ICD-10-CM | POA: Diagnosis not present

## 2018-08-21 DIAGNOSIS — I10 Essential (primary) hypertension: Secondary | ICD-10-CM | POA: Diagnosis not present

## 2018-08-21 DIAGNOSIS — K7689 Other specified diseases of liver: Secondary | ICD-10-CM | POA: Diagnosis not present

## 2018-08-21 DIAGNOSIS — R748 Abnormal levels of other serum enzymes: Secondary | ICD-10-CM | POA: Diagnosis not present

## 2018-12-04 DIAGNOSIS — I10 Essential (primary) hypertension: Secondary | ICD-10-CM | POA: Diagnosis not present

## 2018-12-04 DIAGNOSIS — K7689 Other specified diseases of liver: Secondary | ICD-10-CM | POA: Diagnosis not present

## 2018-12-04 DIAGNOSIS — R609 Edema, unspecified: Secondary | ICD-10-CM | POA: Diagnosis not present

## 2018-12-04 DIAGNOSIS — R748 Abnormal levels of other serum enzymes: Secondary | ICD-10-CM | POA: Diagnosis not present

## 2018-12-04 DIAGNOSIS — E7800 Pure hypercholesterolemia, unspecified: Secondary | ICD-10-CM | POA: Diagnosis not present

## 2018-12-04 DIAGNOSIS — E119 Type 2 diabetes mellitus without complications: Secondary | ICD-10-CM | POA: Diagnosis not present

## 2019-01-18 DIAGNOSIS — G4733 Obstructive sleep apnea (adult) (pediatric): Secondary | ICD-10-CM | POA: Diagnosis not present

## 2019-01-19 DIAGNOSIS — E119 Type 2 diabetes mellitus without complications: Secondary | ICD-10-CM | POA: Diagnosis not present

## 2019-01-19 DIAGNOSIS — I10 Essential (primary) hypertension: Secondary | ICD-10-CM | POA: Diagnosis not present

## 2019-01-19 DIAGNOSIS — E785 Hyperlipidemia, unspecified: Secondary | ICD-10-CM | POA: Diagnosis not present

## 2019-01-19 DIAGNOSIS — Z794 Long term (current) use of insulin: Secondary | ICD-10-CM | POA: Diagnosis not present

## 2019-01-19 DIAGNOSIS — Z0000 Encounter for general adult medical examination without abnormal findings: Secondary | ICD-10-CM | POA: Diagnosis not present

## 2019-04-26 DIAGNOSIS — Z79899 Other long term (current) drug therapy: Secondary | ICD-10-CM | POA: Diagnosis not present

## 2019-04-26 DIAGNOSIS — E782 Mixed hyperlipidemia: Secondary | ICD-10-CM | POA: Diagnosis not present

## 2019-04-26 DIAGNOSIS — R413 Other amnesia: Secondary | ICD-10-CM | POA: Diagnosis not present

## 2019-04-26 DIAGNOSIS — Z794 Long term (current) use of insulin: Secondary | ICD-10-CM | POA: Diagnosis not present

## 2019-04-26 DIAGNOSIS — I10 Essential (primary) hypertension: Secondary | ICD-10-CM | POA: Diagnosis not present

## 2019-04-26 DIAGNOSIS — Z23 Encounter for immunization: Secondary | ICD-10-CM | POA: Diagnosis not present

## 2019-04-26 DIAGNOSIS — E119 Type 2 diabetes mellitus without complications: Secondary | ICD-10-CM | POA: Diagnosis not present

## 2019-06-04 DIAGNOSIS — E7800 Pure hypercholesterolemia, unspecified: Secondary | ICD-10-CM | POA: Diagnosis not present

## 2019-06-04 DIAGNOSIS — K7689 Other specified diseases of liver: Secondary | ICD-10-CM | POA: Diagnosis not present

## 2019-06-04 DIAGNOSIS — I10 Essential (primary) hypertension: Secondary | ICD-10-CM | POA: Diagnosis not present

## 2019-06-04 DIAGNOSIS — E119 Type 2 diabetes mellitus without complications: Secondary | ICD-10-CM | POA: Diagnosis not present

## 2019-06-04 DIAGNOSIS — R609 Edema, unspecified: Secondary | ICD-10-CM | POA: Diagnosis not present

## 2019-06-11 DIAGNOSIS — K7689 Other specified diseases of liver: Secondary | ICD-10-CM | POA: Diagnosis not present

## 2019-06-11 DIAGNOSIS — R609 Edema, unspecified: Secondary | ICD-10-CM | POA: Diagnosis not present

## 2019-06-11 DIAGNOSIS — I10 Essential (primary) hypertension: Secondary | ICD-10-CM | POA: Diagnosis not present

## 2019-06-11 DIAGNOSIS — E119 Type 2 diabetes mellitus without complications: Secondary | ICD-10-CM | POA: Diagnosis not present

## 2019-06-11 DIAGNOSIS — R748 Abnormal levels of other serum enzymes: Secondary | ICD-10-CM | POA: Diagnosis not present

## 2019-06-11 DIAGNOSIS — E7800 Pure hypercholesterolemia, unspecified: Secondary | ICD-10-CM | POA: Diagnosis not present

## 2019-07-26 DIAGNOSIS — E782 Mixed hyperlipidemia: Secondary | ICD-10-CM | POA: Diagnosis not present

## 2019-07-26 DIAGNOSIS — Z794 Long term (current) use of insulin: Secondary | ICD-10-CM | POA: Diagnosis not present

## 2019-07-26 DIAGNOSIS — E119 Type 2 diabetes mellitus without complications: Secondary | ICD-10-CM | POA: Diagnosis not present

## 2019-07-26 DIAGNOSIS — I10 Essential (primary) hypertension: Secondary | ICD-10-CM | POA: Diagnosis not present

## 2019-08-27 DIAGNOSIS — I10 Essential (primary) hypertension: Secondary | ICD-10-CM | POA: Diagnosis not present

## 2019-08-27 DIAGNOSIS — E161 Other hypoglycemia: Secondary | ICD-10-CM | POA: Diagnosis not present

## 2019-08-27 DIAGNOSIS — R404 Transient alteration of awareness: Secondary | ICD-10-CM | POA: Diagnosis not present

## 2019-08-27 DIAGNOSIS — E162 Hypoglycemia, unspecified: Secondary | ICD-10-CM | POA: Diagnosis not present

## 2019-10-27 DIAGNOSIS — Z794 Long term (current) use of insulin: Secondary | ICD-10-CM | POA: Diagnosis not present

## 2019-10-27 DIAGNOSIS — E119 Type 2 diabetes mellitus without complications: Secondary | ICD-10-CM | POA: Diagnosis not present

## 2019-10-27 DIAGNOSIS — I10 Essential (primary) hypertension: Secondary | ICD-10-CM | POA: Diagnosis not present

## 2019-10-27 DIAGNOSIS — E782 Mixed hyperlipidemia: Secondary | ICD-10-CM | POA: Diagnosis not present

## 2019-12-01 DIAGNOSIS — K7689 Other specified diseases of liver: Secondary | ICD-10-CM | POA: Diagnosis not present

## 2019-12-01 DIAGNOSIS — E7800 Pure hypercholesterolemia, unspecified: Secondary | ICD-10-CM | POA: Diagnosis not present

## 2019-12-01 DIAGNOSIS — E119 Type 2 diabetes mellitus without complications: Secondary | ICD-10-CM | POA: Diagnosis not present

## 2019-12-01 DIAGNOSIS — R609 Edema, unspecified: Secondary | ICD-10-CM | POA: Diagnosis not present

## 2019-12-01 DIAGNOSIS — I10 Essential (primary) hypertension: Secondary | ICD-10-CM | POA: Diagnosis not present

## 2019-12-10 DIAGNOSIS — E119 Type 2 diabetes mellitus without complications: Secondary | ICD-10-CM | POA: Diagnosis not present

## 2019-12-10 DIAGNOSIS — K7689 Other specified diseases of liver: Secondary | ICD-10-CM | POA: Diagnosis not present

## 2019-12-10 DIAGNOSIS — E7800 Pure hypercholesterolemia, unspecified: Secondary | ICD-10-CM | POA: Diagnosis not present

## 2019-12-10 DIAGNOSIS — I10 Essential (primary) hypertension: Secondary | ICD-10-CM | POA: Diagnosis not present

## 2019-12-10 DIAGNOSIS — R748 Abnormal levels of other serum enzymes: Secondary | ICD-10-CM | POA: Diagnosis not present

## 2019-12-10 DIAGNOSIS — R609 Edema, unspecified: Secondary | ICD-10-CM | POA: Diagnosis not present

## 2020-01-20 DIAGNOSIS — G4733 Obstructive sleep apnea (adult) (pediatric): Secondary | ICD-10-CM | POA: Diagnosis not present

## 2020-01-28 DIAGNOSIS — Z0000 Encounter for general adult medical examination without abnormal findings: Secondary | ICD-10-CM | POA: Diagnosis not present

## 2020-01-28 DIAGNOSIS — Z1231 Encounter for screening mammogram for malignant neoplasm of breast: Secondary | ICD-10-CM | POA: Diagnosis not present

## 2020-01-28 DIAGNOSIS — N959 Unspecified menopausal and perimenopausal disorder: Secondary | ICD-10-CM | POA: Diagnosis not present

## 2020-01-28 DIAGNOSIS — Z23 Encounter for immunization: Secondary | ICD-10-CM | POA: Diagnosis not present

## 2020-01-28 DIAGNOSIS — E782 Mixed hyperlipidemia: Secondary | ICD-10-CM | POA: Diagnosis not present

## 2020-01-28 DIAGNOSIS — I10 Essential (primary) hypertension: Secondary | ICD-10-CM | POA: Diagnosis not present

## 2020-01-28 DIAGNOSIS — Z794 Long term (current) use of insulin: Secondary | ICD-10-CM | POA: Diagnosis not present

## 2020-01-28 DIAGNOSIS — E119 Type 2 diabetes mellitus without complications: Secondary | ICD-10-CM | POA: Diagnosis not present

## 2020-03-03 DIAGNOSIS — N959 Unspecified menopausal and perimenopausal disorder: Secondary | ICD-10-CM | POA: Diagnosis not present

## 2020-03-03 DIAGNOSIS — Z780 Asymptomatic menopausal state: Secondary | ICD-10-CM | POA: Diagnosis not present

## 2020-03-03 DIAGNOSIS — Z1231 Encounter for screening mammogram for malignant neoplasm of breast: Secondary | ICD-10-CM | POA: Diagnosis not present
